# Patient Record
Sex: Female | Born: 1955 | Race: White | Hispanic: No | Smoking: Never smoker
Health system: Southern US, Community
[De-identification: ages and names within clinical notes are randomized; demographics above are authoritative.]

## PROBLEM LIST (undated history)

## (undated) DIAGNOSIS — C801 Malignant (primary) neoplasm, unspecified: Secondary | ICD-10-CM

## (undated) HISTORY — PX: ABDOMINAL HYSTERECTOMY: SHX81

## (undated) HISTORY — PX: RECONSTRUCTION BREAST IMMEDIATE / DELAYED W/ TISSUE EXPANDER: SUR1077

## (undated) HISTORY — PX: MASTECTOMY: SHX3

## (undated) HISTORY — PX: BREAST SURGERY: SHX581

---

## 2018-08-15 ENCOUNTER — Ambulatory Visit
Admission: EM | Admit: 2018-08-15 | Discharge: 2018-08-15 | Disposition: A | Discharge status: Home / Self Care | Payer: BLUE CROSS/BLUE SHIELD | Attending: Family Medicine | Admitting: Family Medicine

## 2018-08-15 ENCOUNTER — Encounter: Payer: Self-pay | Admitting: Gynecology

## 2018-08-15 ENCOUNTER — Other Ambulatory Visit: Payer: Self-pay

## 2018-08-15 DIAGNOSIS — K047 Periapical abscess without sinus: Secondary | ICD-10-CM

## 2018-08-15 HISTORY — DX: Malignant (primary) neoplasm, unspecified: C80.1

## 2018-08-15 MED ORDER — AMOXICILLIN-POT CLAVULANATE 875-125 MG PO TABS: 1.0000 | ORAL_TABLET | Freq: Two times a day (BID) | ORAL | 0 refills | Status: DC

## 2018-08-15 NOTE — ED Provider Notes (Signed)
[facility]    CSN: [id number] Arrival date & time: 08/15/18  1246  History   Chief Complaint Chief Complaint  Patient presents with  . Oral Swelling   HPI  62 year old female presents with the above complaint.  Patient reports that she recently had a dental cleaning on Thursday.  States that she has now developed facial swelling.  She is having dental pain as well.  Location: Right lower molar.  States that she has noticed pus coming from underneath the tooth.  Very painful.  No fever.  No chills.  No medications tried for this.  No other associated symptoms. No other complaints.  PMH, Surgical Hx, Family Hx, Social History reviewed and updated as below.  Past Medical History:  Diagnosis Date  . Cancer [facility])    breast   Past Surgical History:  Procedure Laterality Date  . ABDOMINAL HYSTERECTOMY    . BREAST SURGERY Left    breast cancer  . MASTECTOMY Bilateral    breast cancer  . RECONSTRUCTION BREAST IMMEDIATE / DELAYED W/ TISSUE EXPANDER     OB History   None    Home Medications    Prior to Admission medications   Medication Sig Start Date End Date Taking? Authorizing Provider  folic acid (FOLVITE) 0.5 MG tablet folic acid   Yes [provider]  vitamin B-12 (CYANOCOBALAMIN) 100 MCG tablet Vitamin B12   Yes [provider]  amoxicillin-clavulanate (AUGMENTIN) 875-125 MG tablet Take 1 tablet by mouth every 12 (twelve) hours. 08/15/18   Coral Spikes, DO   Family History Family History  Problem Relation Age of Onset  . Cancer Mother   . COPD Mother   . Dementia Father    Social History Social History   Tobacco Use  . Smoking status: Never Smoker  . Smokeless tobacco: Never Used  Substance Use Topics  . Alcohol use: Never    Frequency: Never  . Drug use: Never   Allergies   Erythromycin   Review of Systems Review of Systems  Constitutional: Negative.   HENT: Positive for dental problem.    Physical Exam Triage  Vital Signs ED Triage Vitals  Enc Vitals Group     BP 08/15/18 1306 133/74     Pulse Rate 08/15/18 1306 93     Resp 08/15/18 1306 16     Temp 08/15/18 1306 97.9 F (36.6 C)     Temp Source 08/15/18 1306 Oral     SpO2 08/15/18 1306 99 %     Weight 08/15/18 1307 125 lb (56.7 kg)     Height 08/15/18 1307 5\' 4"  (1.626 m)     Head Circumference --      Peak Flow --      Pain Score 08/15/18 1305 3     Pain Loc --      Pain Edu? --      Excl. in [place]? --    Updated Vital Signs BP 133/74 (BP Location: Left Arm)   Pulse 93   Temp 97.9 F (36.6 C) (Oral)   Resp 16   Ht 5\' 4"  (1.626 m)   Wt 56.7 kg   SpO2 99%   BMI 21.46 kg/m   Visual Acuity Right Eye Distance:   Left Eye Distance:   Bilateral Distance:    Right Eye Near:   Left Eye Near:    Bilateral Near:     Physical Exam  Constitutional: She is oriented to person, place, and time. She appears well-developed.  No distress.  HENT:  Head: Normocephalic and atraumatic.    Mouth/Throat:    Swelling noted at the labeled location. Pus noted around the gumline underneath the labeled tooth.  Eyes: Conjunctivae are normal. Right eye exhibits no discharge. Left eye exhibits no discharge.  Pulmonary/Chest: Effort normal. No respiratory distress.  Neurological: She is alert and oriented to person, place, and time.  Psychiatric: She has a normal mood and affect. Her behavior is normal.  Nursing note and vitals reviewed.  UC Treatments / Results  Labs (all labs ordered are listed, but only abnormal results are displayed) Labs Reviewed - No data to display  EKG None  Radiology No results found.  Procedures Procedures (including critical care time)  Medications Ordered in UC Medications - No data to display  Initial Impression / Assessment and Plan / UC Course  I have reviewed the triage vital signs and the nursing notes.  Pertinent labs & imaging results that were available during my care of the patient were  reviewed by me and considered in my medical decision making (see chart for details).    62 year old female presents with dental infection.  Placing on Augmentin.  Advised to call dentist.  Final Clinical Impressions(s) / UC Diagnoses   Final diagnoses:  Dental infection     Discharge Instructions     Antibiotic as prescribed.  Call dentist.  Take care  Dr. Lacinda Axon    ED Prescriptions    Medication Sig Dispense Auth. Provider   amoxicillin-clavulanate (AUGMENTIN) 875-125 MG tablet Take 1 tablet by mouth every 12 (twelve) hours. 14 tablet Coral Spikes, DO     Controlled Substance Prescriptions [place] Controlled Substance Registry consulted? Not Applicable   Coral Spikes, DO 08/15/18 1407

## 2018-08-15 NOTE — ED Triage Notes (Signed)
Per patient right side mouth/gum infection. Per patient seen her dentist x 3 days ago for a cleaning. Per patient gum start bleeding after her appointment.

## 2018-08-15 NOTE — Discharge Instructions (Signed)
Antibiotic as prescribed.  Call dentist.  Take care  Dr. Lacinda Axon

## 2018-11-14 ENCOUNTER — Ambulatory Visit
Admission: EM | Admit: 2018-11-14 | Discharge: 2018-11-14 | Disposition: A | Discharge status: Home / Self Care | Payer: BLUE CROSS/BLUE SHIELD | Attending: Emergency Medicine | Admitting: Emergency Medicine

## 2018-11-14 ENCOUNTER — Encounter: Payer: Self-pay | Admitting: Gynecology

## 2018-11-14 ENCOUNTER — Other Ambulatory Visit: Payer: Self-pay

## 2018-11-14 ENCOUNTER — Ambulatory Visit (INDEPENDENT_AMBULATORY_CARE_PROVIDER_SITE_OTHER): Payer: BLUE CROSS/BLUE SHIELD

## 2018-11-14 DIAGNOSIS — R05 Cough: Secondary | ICD-10-CM

## 2018-11-14 DIAGNOSIS — R5383 Other fatigue: Secondary | ICD-10-CM | POA: Diagnosis not present

## 2018-11-14 DIAGNOSIS — R0982 Postnasal drip: Secondary | ICD-10-CM | POA: Diagnosis not present

## 2018-11-14 DIAGNOSIS — R0981 Nasal congestion: Secondary | ICD-10-CM

## 2018-11-14 DIAGNOSIS — R509 Fever, unspecified: Secondary | ICD-10-CM

## 2018-11-14 DIAGNOSIS — J111 Influenza due to unidentified influenza virus with other respiratory manifestations: Secondary | ICD-10-CM

## 2018-11-14 DIAGNOSIS — R69 Illness, unspecified: Principal | ICD-10-CM

## 2018-11-14 LAB — RAPID INFLUENZA A&B ANTIGENS
Influenza A (ARMC): NEGATIVE
Influenza B (ARMC): NEGATIVE

## 2018-11-14 MED ORDER — BENZONATATE 200 MG PO CAPS: ORAL_CAPSULE | ORAL | 0 refills | Status: AC

## 2018-11-14 MED ORDER — HYDROCOD POLST-CPM POLST ER 10-8 MG/5ML PO SUER: 5.0000 mL | Freq: Two times a day (BID) | ORAL | 0 refills | Status: AC

## 2018-11-14 MED ORDER — OSELTAMIVIR PHOSPHATE 75 MG PO CAPS: 75.0000 mg | ORAL_CAPSULE | Freq: Two times a day (BID) | ORAL | 0 refills | Status: AC

## 2018-11-14 NOTE — ED Triage Notes (Signed)
Patient c/o cough . Per patient greenish sputum and headache. Fever at home of 101 last night.

## 2018-11-14 NOTE — ED Provider Notes (Signed)
[facility]    CSN: [id number] Arrival date & time: 11/14/18  6761     History   Chief Complaint No chief complaint on file.   HPI Stephanie Munoz is a 63 y.o. female.   HPI  63 year old female presents with coughing.  States that she hit her hard and suddenly on Friday.  Days prior to this admission.  She has been coughing up greenish sputum and has headache.  Fever at home last night was 101 today is 99.2.  Pulse rate of 112 O2 sats 90% on room air with respirations of 16.  Did not have a flu shot this year.         Past Medical History:  Diagnosis Date  . Cancer [facility])    breast    There are no active problems to display for this patient.   Past Surgical History:  Procedure Laterality Date  . ABDOMINAL HYSTERECTOMY    . BREAST SURGERY Left    breast cancer  . MASTECTOMY Bilateral    breast cancer  . RECONSTRUCTION BREAST IMMEDIATE / DELAYED W/ TISSUE EXPANDER      OB History   No obstetric history on file.      Home Medications    Prior to Admission medications   Medication Sig Start Date End Date Taking? Authorizing Provider  aspirin EC 81 MG tablet Take 81 mg by mouth daily.   Yes [provider]  folic acid (FOLVITE) 0.5 MG tablet folic acid   Yes [provider]  vitamin B-12 (CYANOCOBALAMIN) 100 MCG tablet Vitamin B12   Yes [provider]  vitamin C (ASCORBIC ACID) 500 MG tablet Take 500 mg by mouth daily.   Yes [provider]  benzonatate (TESSALON) 200 MG capsule Take one cap TID PRN cough 11/14/18   Lorin Picket, PA-C  chlorpheniramine-HYDROcodone [facility]) 10-8 MG/5ML SUER Take 5 mLs by mouth 2 (two) times daily. 11/14/18   Lorin Picket, PA-C  gabapentin (NEURONTIN) 300 MG capsule gabapentin 300 mg capsule    [provider]  oseltamivir (TAMIFLU) 75 MG capsule Take 1 capsule (75 mg total) by mouth every 12 (twelve) hours. 11/14/18   Lorin Picket, PA-C    traMADol (ULTRAM) 50 MG tablet tramadol 50 mg tablet    [provider]    Family History Family History  Problem Relation Age of Onset  . Cancer Mother   . COPD Mother   . Dementia Father     Social History Social History   Tobacco Use  . Smoking status: Never Smoker  . Smokeless tobacco: Never Used  Substance Use Topics  . Alcohol use: Never    Frequency: Never  . Drug use: Never     Allergies   Erythromycin   Review of Systems Review of Systems  Constitutional: Positive for activity change, chills, fatigue and fever.  HENT: Positive for congestion, postnasal drip and rhinorrhea.   Respiratory: Positive for cough.   All other systems reviewed and are negative.    Physical Exam Triage Vital Signs ED Triage Vitals  Enc Vitals Group     BP 11/14/18 0833 113/80     Pulse Rate 11/14/18 0833 (!) 112     Resp 11/14/18 0833 16     Temp 11/14/18 0833 99.2 F (37.3 C)     Temp Source 11/14/18 0833 Oral     SpO2 11/14/18 0833 98 %     Weight 11/14/18 0835 126 lb (57.2 kg)  Height 11/14/18 0835 5\' 4"  (1.626 m)     Head Circumference --      Peak Flow --      Pain Score 11/14/18 0835 6     Pain Loc --      Pain Edu? --      Excl. in [place]? --    No data found.  Updated Vital Signs BP 113/80 (BP Location: Left Arm)   Pulse (!) 112   Temp 99.2 F (37.3 C) (Oral)   Resp 16   Ht 5\' 4"  (1.626 m)   Wt 126 lb (57.2 kg)   SpO2 98%   BMI 21.63 kg/m   Visual Acuity Right Eye Distance:   Left Eye Distance:   Bilateral Distance:    Right Eye Near:   Left Eye Near:    Bilateral Near:     Physical Exam Vitals signs reviewed.  Constitutional:      General: She is not in acute distress.    Appearance: Normal appearance. She is normal weight. She is not ill-appearing, toxic-appearing or diaphoretic.  HENT:     Head: Normocephalic and atraumatic.     Right Ear: Tympanic membrane, ear canal and external ear normal.     Left Ear: Tympanic membrane,  ear canal and external ear normal.     Nose: Nose normal. No congestion or rhinorrhea.     Mouth/Throat:     Mouth: Mucous membranes are moist.     Pharynx: Oropharynx is clear.  Eyes:     General:        Right eye: No discharge.        Left eye: No discharge.     Conjunctiva/sclera: Conjunctivae normal.  Neck:     Musculoskeletal: Normal range of motion and neck supple.  Pulmonary:     Effort: Pulmonary effort is normal.     Comments: She has very coarse breath sounds on the right upper lobe.  Rhonchi present. Musculoskeletal: Normal range of motion.  Lymphadenopathy:     Cervical: No cervical adenopathy.  Skin:    General: Skin is warm and dry.  Neurological:     General: No focal deficit present.     Mental Status: She is alert and oriented to person, place, and time.  Psychiatric:        Mood and Affect: Mood normal.        Behavior: Behavior normal.        Thought Content: Thought content normal.        Judgment: Judgment normal.      UC Treatments / Results  Labs (all labs ordered are listed, but only abnormal results are displayed) Labs Reviewed  RAPID INFLUENZA A&B ANTIGENS ([place])    EKG None  Radiology Dg Chest 2 View  Result Date: 11/14/2018 CLINICAL DATA:  Cough and congestion.  History of breast carcinoma EXAM: CHEST - 2 VIEW COMPARISON:  None. FINDINGS: There is scarring with volume loss in the medial and apical aspects of the left upper lobe. There is also scarring in the periphery of the left lung both superiorly and more inferiorly. The lungs elsewhere are clear. Heart size and pulmonary vascularity are normal. No adenopathy. There are postoperative changes in each breast/axillary region. No appreciable bone lesions. IMPRESSION: Areas of scarring with volume loss on the left, likely secondary to radiation therapy change. No frank edema or consolidation. Heart size and pulmonary vascularity are normal. No adenopathy. Given the lung changes and absence  of prior available  study for comparison, it may be prudent to consider a follow-up chest radiograph in approximately 3 months to assess for stability. Electronically Signed   By: Lowella Grip III M.D.   On: 11/14/2018 09:44    Procedures Procedures (including critical care time)  Medications Ordered in UC Medications - No data to display  Initial Impression / Assessment and Plan / UC Course  I have reviewed the triage vital signs and the nursing notes.  Pertinent labs & imaging results that were available during my care of the patient were reviewed by me and considered in my medical decision making (see chart for details).   Despite a negative flu test patient has influenza-like symptoms will be treated as such.  Straight today showed low lung volumes without comparison films available.  Recommended that she follow-up in 3 months with another chest x-ray to check for any changes.  Relayed in detail to the patient.  Will arrange this through her primary care physician.  I will prescribe Tamiflu and cough suppressants.  She will not return to work until 24 hours following the cessation of her temperature.   Final Clinical Impressions(s) / UC Diagnoses   Final diagnoses:  Influenza-like illness     Discharge Instructions     Drink plenty of fluids.  Rest as much as possible.  Use Tylenol or Motrin for fever chills or body aches.  Recommend following up with your primary care physician in 3 months for repeat chest x-ray due to the decreased lung volumes that was seen today.  We discussed this in detail.    ED Prescriptions    Medication Sig Dispense Auth. Provider   oseltamivir (TAMIFLU) 75 MG capsule Take 1 capsule (75 mg total) by mouth every 12 (twelve) hours. 10 capsule Lorin Picket, PA-C   benzonatate (TESSALON) 200 MG capsule Take one cap TID PRN cough 30 capsule Lorin Picket, PA-C   chlorpheniramine-HYDROcodone (TUSSIONEX PENNKINETIC ER) 10-8 MG/5ML SUER Take 5 mLs  by mouth 2 (two) times daily. 115 mL Lorin Picket, PA-C     Controlled Substance Prescriptions [place] Controlled Substance Registry consulted? Not Applicable   Lorin Picket, PA-C 11/14/18 6754

## 2018-11-14 NOTE — Discharge Instructions (Addendum)
Drink plenty of fluids.  Rest as much as possible.  Use Tylenol or Motrin for fever chills or body aches.  Recommend following up with your primary care physician in 3 months for repeat chest x-ray due to the decreased lung volumes that was seen today.  We discussed this in detail.

## 2018-12-29 DEATH — deceased

## 2019-11-10 IMAGING — CR DG CHEST 2V
2 series · 2 of 2 positions shown · non-contrast
Comparison: None.

CLINICAL DATA: Cough and congestion.  History of breast carcinoma

EXAM:
CHEST - 2 VIEW

[chest pa]
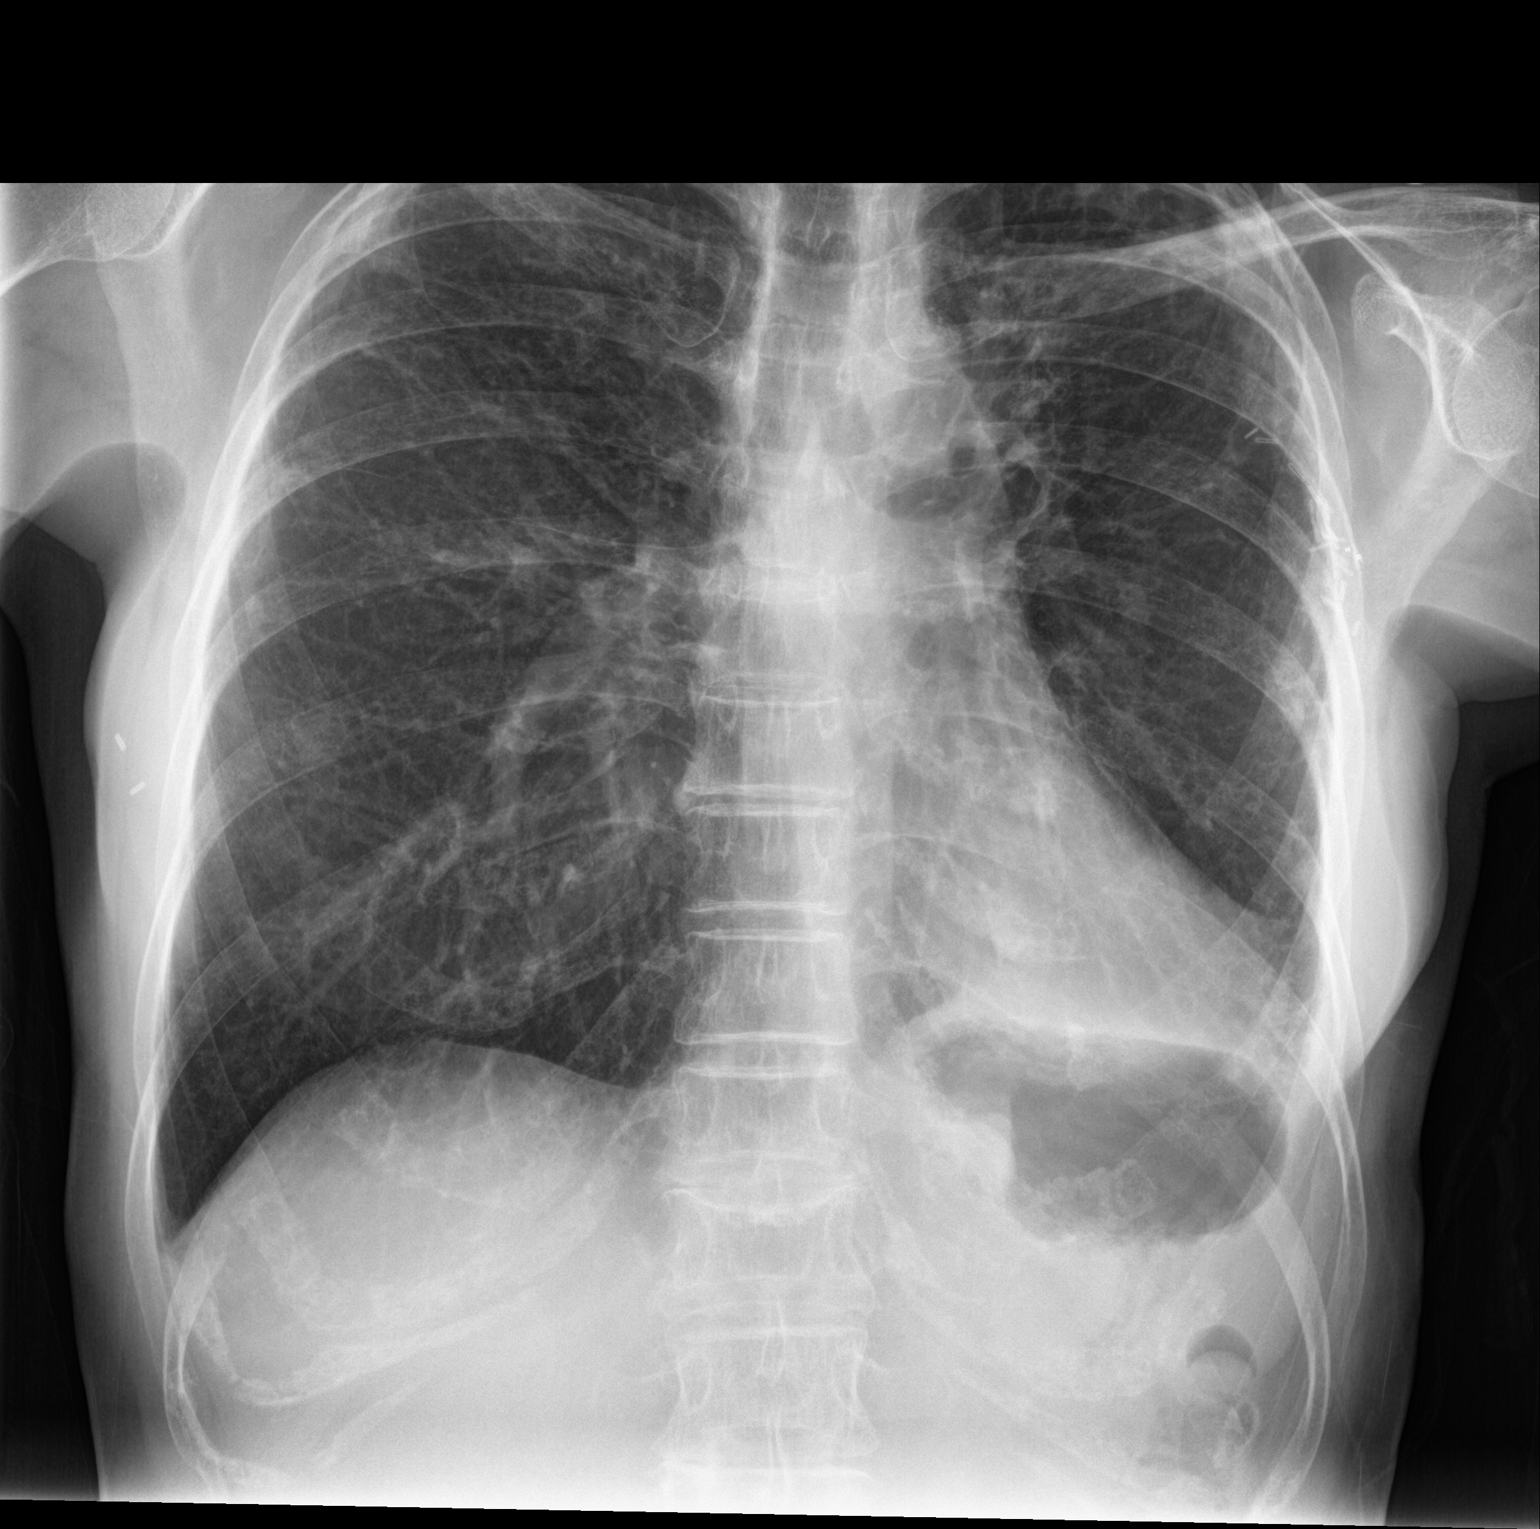

[chest lat]
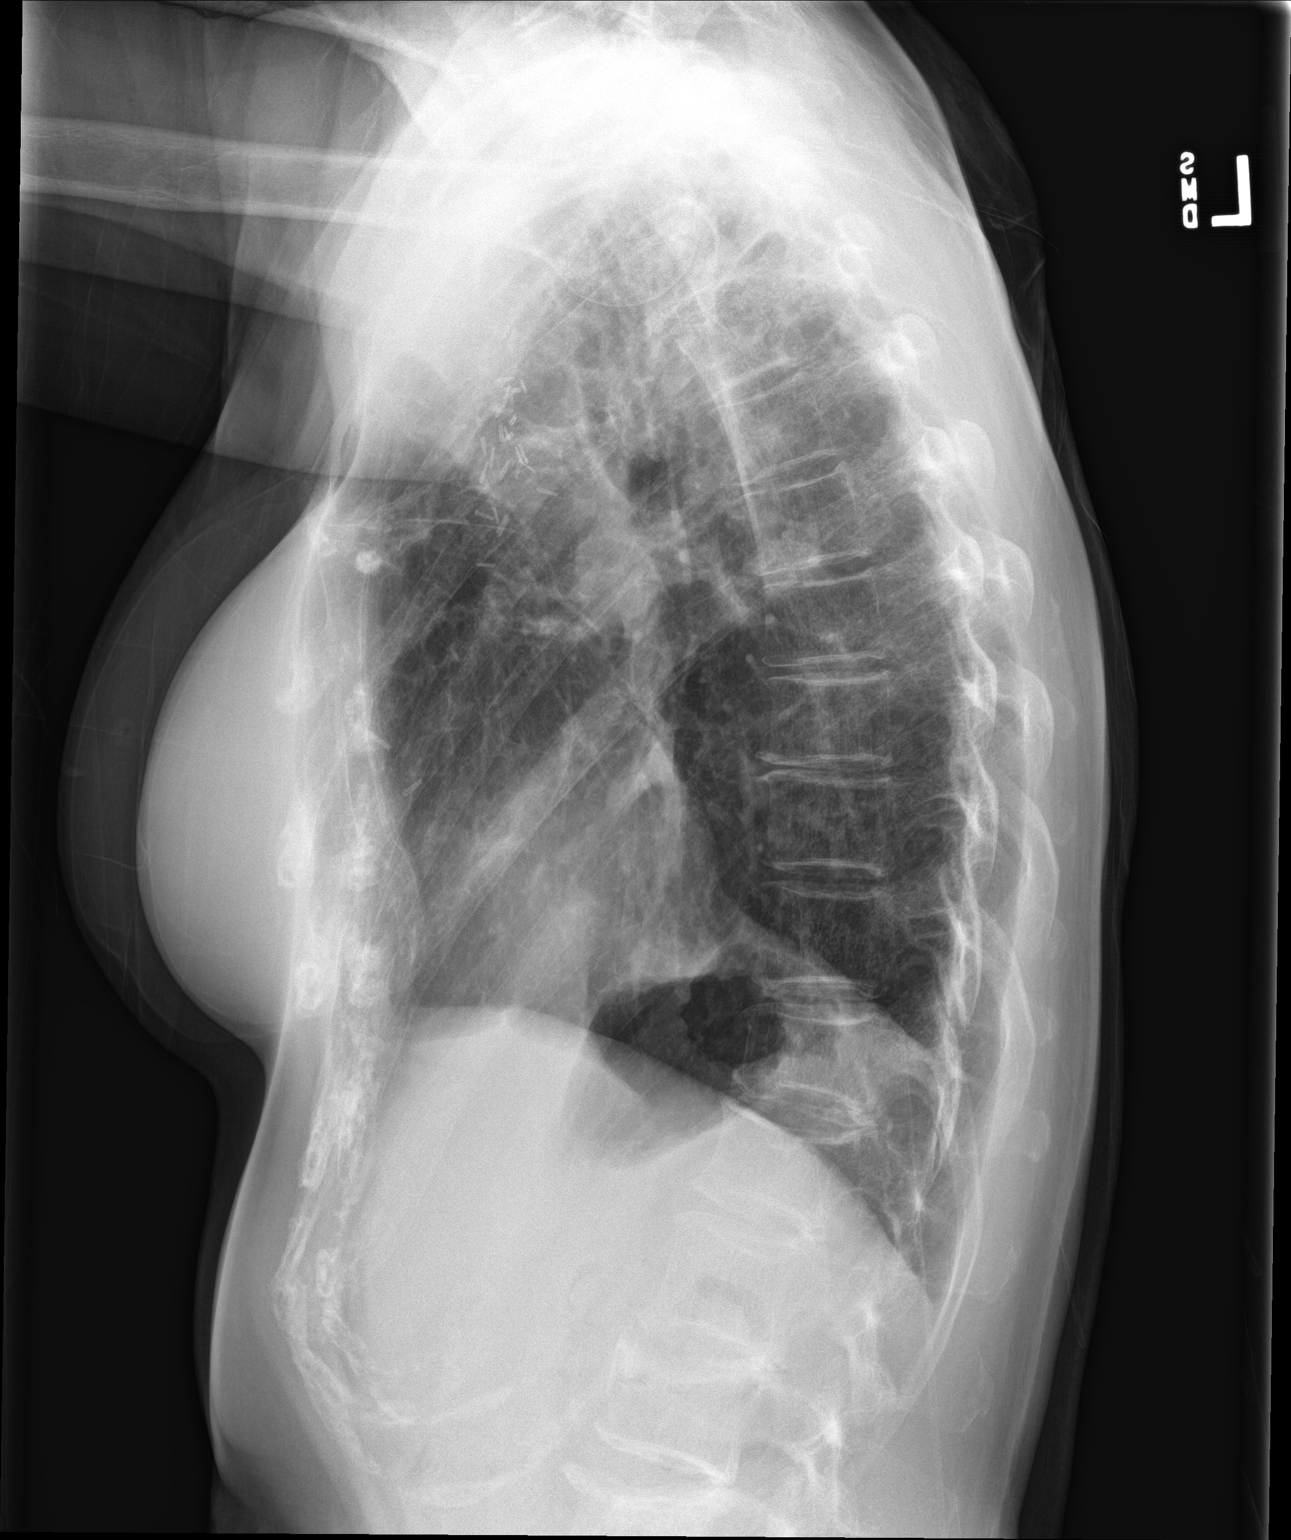

[2 of 2 positions shown; findings below may reference images not displayed]

FINDINGS: There is scarring with volume loss in the medial and apical aspects
of the left upper lobe. There is also scarring in the periphery of
the left lung both superiorly and more inferiorly. The lungs
elsewhere are clear. Heart size and pulmonary vascularity are
normal. No adenopathy. There are postoperative changes in each
breast/axillary region. No appreciable bone lesions.
IMPRESSION: Areas of scarring with volume loss on the left, likely secondary to
radiation therapy change. No frank edema or consolidation. Heart
size and pulmonary vascularity are normal. No adenopathy.

Given the lung changes and absence of prior available study for
comparison, it may be prudent to consider a follow-up chest
radiograph in approximately 3 months to assess for stability.
